# Patient Record
Sex: Female | Born: 1985 | Race: Black or African American | Hispanic: No | Marital: Single | Smoking: Former smoker
Health system: Southern US, Community
[De-identification: ages and names within clinical notes are randomized; demographics above are authoritative.]

## PROBLEM LIST (undated history)

## (undated) DIAGNOSIS — Z789 Other specified health status: Secondary | ICD-10-CM

## (undated) HISTORY — PX: NO PAST SURGERIES: SHX2092

---

## 2013-05-31 ENCOUNTER — Encounter (HOSPITAL_COMMUNITY): Payer: Self-pay | Admitting: Emergency Medicine

## 2013-05-31 ENCOUNTER — Emergency Department (HOSPITAL_COMMUNITY)
Admission: EM | Admit: 2013-05-31 | Discharge: 2013-05-31 | Disposition: A | Discharge status: Home / Self Care | Payer: Medicaid Other | Attending: Emergency Medicine | Admitting: Emergency Medicine

## 2013-05-31 DIAGNOSIS — K0889 Other specified disorders of teeth and supporting structures: Secondary | ICD-10-CM

## 2013-05-31 DIAGNOSIS — H9209 Otalgia, unspecified ear: Secondary | ICD-10-CM | POA: Insufficient documentation

## 2013-05-31 DIAGNOSIS — F17200 Nicotine dependence, unspecified, uncomplicated: Secondary | ICD-10-CM | POA: Insufficient documentation

## 2013-05-31 DIAGNOSIS — K089 Disorder of teeth and supporting structures, unspecified: Secondary | ICD-10-CM | POA: Insufficient documentation

## 2013-05-31 MED ORDER — OXYCODONE-ACETAMINOPHEN 5-325 MG PO TABS: 1.0000 | ORAL_TABLET | ORAL | Status: DC | PRN

## 2013-05-31 MED ORDER — OXYCODONE-ACETAMINOPHEN 5-325 MG PO TABS
1.0000 | ORAL_TABLET | Freq: Once | ORAL | Status: AC
  Administered 2013-05-31: 1 via ORAL
  Filled 2013-05-31: qty 1

## 2013-05-31 MED ORDER — AMOXICILLIN 500 MG PO CAPS: 500.0000 mg | ORAL_CAPSULE | Freq: Three times a day (TID) | ORAL | Status: DC

## 2013-05-31 NOTE — ED Provider Notes (Signed)
Medical screening examination/treatment/procedure(s) were performed by non-physician practitioner and as supervising physician I was immediately available for consultation/collaboration.  Chante Mayson, MD 05/31/13 1619 

## 2013-05-31 NOTE — ED Notes (Signed)
Upper rt tooth pain since wed

## 2013-05-31 NOTE — ED Notes (Signed)
C/O RIGHT UPPER DENTAL PAIN X 2 DAYS. 

## 2013-05-31 NOTE — ED Provider Notes (Signed)
  CSN: [id number]     Arrival date & time 05/31/13  1125 History     First MD Initiated Contact with Patient 05/31/13 1132     Chief Complaint  Patient presents with  . Dental Pain   (Consider location/radiation/quality/duration/timing/severity/associated sxs/prior Treatment) HPI Comments: Patient presents to the emergency department with a dental complaint. Symptoms began on Wednesday. The patient has tried to alleviate pain with Tylenol and Motrin.  Pain rated at a 10/10, characterized as throbbing in nature and located right upper mouth w/ radiation to right ear. Patient denies fever, night sweats, chills, difficulty swallowing or opening mouth, SOB, nuchal rigidity or decreased ROM of neck.  Patient does not have a dentist and requests a resource guide at discharge.   Patient is a 27 y.o. female presenting with tooth pain.  Dental Pain Associated symptoms: no congestion, no drooling, no facial swelling, no fever and no neck pain     History reviewed. No pertinent past medical history. No past surgical history on file. No family history on file. History  Substance Use Topics  . Smoking status: Current Every Day Smoker  . Smokeless tobacco: Not on file  . Alcohol Use: Yes   OB History   Grav Para Term Preterm Abortions TAB SAB Ect Mult Living                 Review of Systems  Constitutional: Negative for fever.  HENT: Positive for ear pain and dental problem. Negative for congestion, sore throat, facial swelling, drooling, trouble swallowing, neck pain and voice change.   All other systems reviewed and are negative.    Allergies  Review of patient's allergies indicates no known allergies.  Home Medications   Current Outpatient Rx  Name  Route  Sig  Dispense  Refill  . amoxicillin (AMOXIL) 500 MG capsule   Oral   Take 1 capsule (500 mg total) by mouth 3 (three) times daily.   30 capsule   0   . oxyCODONE-acetaminophen (PERCOCET/ROXICET) 5-325 MG per tablet    Oral   Take 1 tablet by mouth every 4 (four) hours as needed for pain.   25 tablet   0    BP 117/63  Pulse 101  Temp(Src) 98.3 F (36.8 C)  Resp 16  SpO2 99% Physical Exam  Constitutional: She is oriented to person, place, and time. She appears well-developed and well-nourished. No distress.  HENT:  Head: Normocephalic and atraumatic.  Mouth/Throat: Uvula is midline, oropharynx is clear and moist and mucous membranes are normal. She does not have dentures. No oral lesions. No trismus in the jaw. Abnormal dentition. No dental abscesses, edematous or lacerations.    Patient was otherwise good dentition aside from right upper molar marked graphical documentation  Eyes: Conjunctivae are normal.  Neck: Neck supple.  Neurological: She is alert and oriented to person, place, and time.  Skin: Skin is warm and dry. She is not diaphoretic.  Psychiatric: She has a normal mood and affect.    ED Course   Procedures (including critical care time)  Labs Reviewed - No data to display No results found. 1. Pain, dental     MDM  Patient with toothache.  No gross abscess.  Exam unconcerning for Ludwig's angina or spread of infection.  Will treat with amoxicillin and pain medicine.  Urged patient to follow-up with dentist. Patient is agreeable to plan. Patient is stable at time of discharge     Jeannetta Ellis, PA-C 05/31/13 1422

## 2013-10-17 NOTE — L&D Delivery Note (Signed)
Delivery Note AROM at 0650 with clear fluid. At 6:57 AM a viable female was delivered via  (Presentation: LAO;  ).  APGAR:9/9 , ; weightpending .   Placenta status: Intact, Spontaneous. 3V Cord:  with the following complications:none  Anesthesia:  none Episiotomy: none Lacerations: none Suture Repair: n/a Est. Blood Loss (mL): 200  Mom to postpartum.  Baby to Couplet care / Skin to Skin  Pt plans BTL at 1200.  CRESENZO-DISHMAN,Lynnlee Revels 04/25/2014, 7:09 AM

## 2013-10-17 NOTE — L&D Delivery Note (Signed)

## 2013-11-11 ENCOUNTER — Other Ambulatory Visit (HOSPITAL_COMMUNITY): Payer: Self-pay | Admitting: Nurse Practitioner

## 2013-11-11 DIAGNOSIS — Z3689 Encounter for other specified antenatal screening: Secondary | ICD-10-CM

## 2013-11-11 LAB — OB RESULTS CONSOLE ABO/RH: RH TYPE: POSITIVE

## 2013-11-11 LAB — OB RESULTS CONSOLE GC/CHLAMYDIA
CHLAMYDIA, DNA PROBE: NEGATIVE
Gonorrhea: NEGATIVE

## 2013-11-11 LAB — OB RESULTS CONSOLE RUBELLA ANTIBODY, IGM: Rubella: IMMUNE

## 2013-11-11 LAB — OB RESULTS CONSOLE ANTIBODY SCREEN: Antibody Screen: NEGATIVE

## 2013-11-11 LAB — OB RESULTS CONSOLE RPR: RPR: NONREACTIVE

## 2013-11-11 LAB — OB RESULTS CONSOLE HEPATITIS B SURFACE ANTIGEN: HEP B S AG: NEGATIVE

## 2013-11-11 LAB — OB RESULTS CONSOLE HIV ANTIBODY (ROUTINE TESTING): HIV: NONREACTIVE

## 2013-12-17 ENCOUNTER — Ambulatory Visit (HOSPITAL_COMMUNITY)
Admission: RE | Admit: 2013-12-17 | Discharge: 2013-12-17 | Disposition: A | Discharge status: Home / Self Care | Payer: Medicaid Other | Source: Ambulatory Visit | Attending: Nurse Practitioner | Admitting: Nurse Practitioner

## 2013-12-17 DIAGNOSIS — Z3689 Encounter for other specified antenatal screening: Secondary | ICD-10-CM | POA: Insufficient documentation

## 2013-12-17 IMAGING — US US OB COMP +14 WK
2 series · 12 of 28 positions shown · non-contrast
Comparison: none

[Series 1: us ob comp +14 wk · 1 of 8 slices shown (1 of 2)]
[im 8/8]
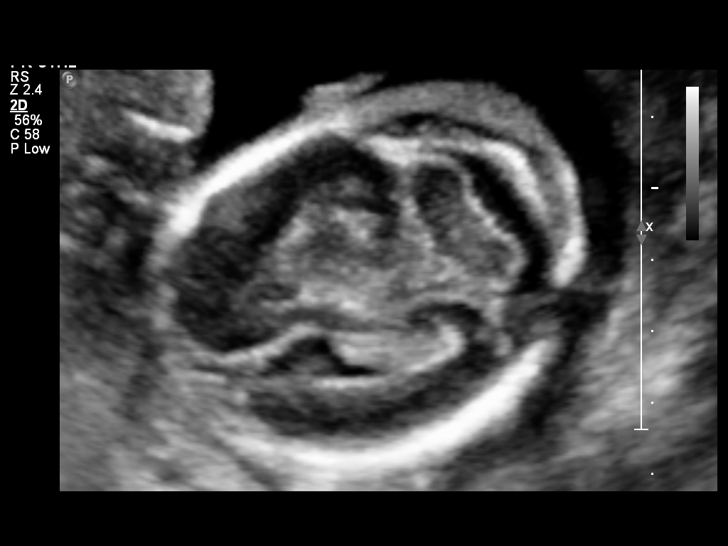

[Series 1: us ob comp +14 wk · 11 of 85 slices shown (2 of 2)]
[im 4/85]
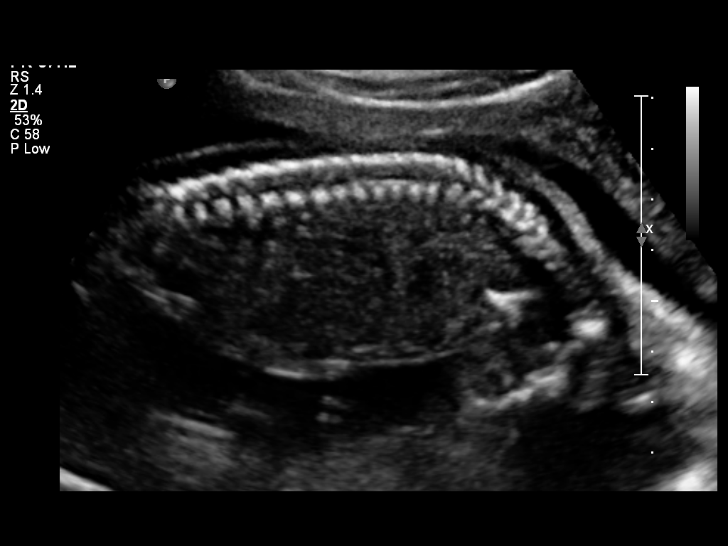
[im 11/85]
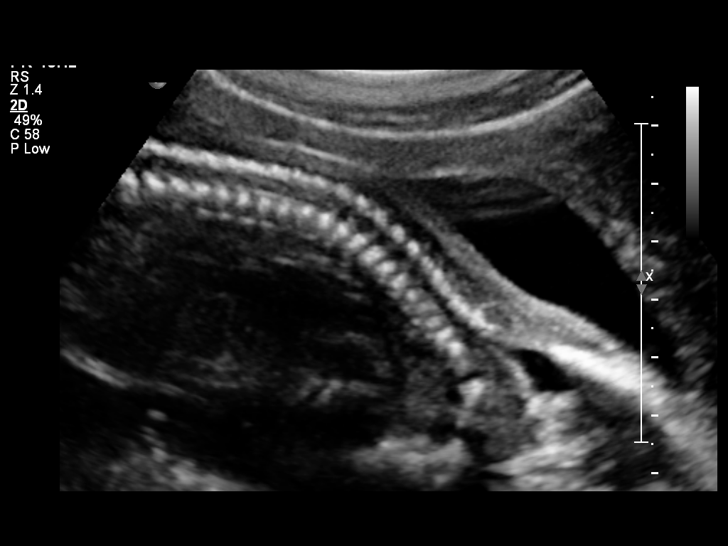
[im 21/85]
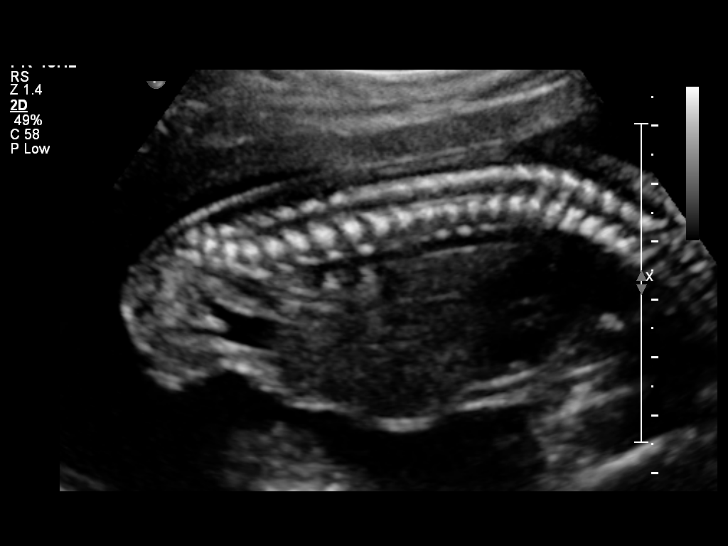
[im 27/85]
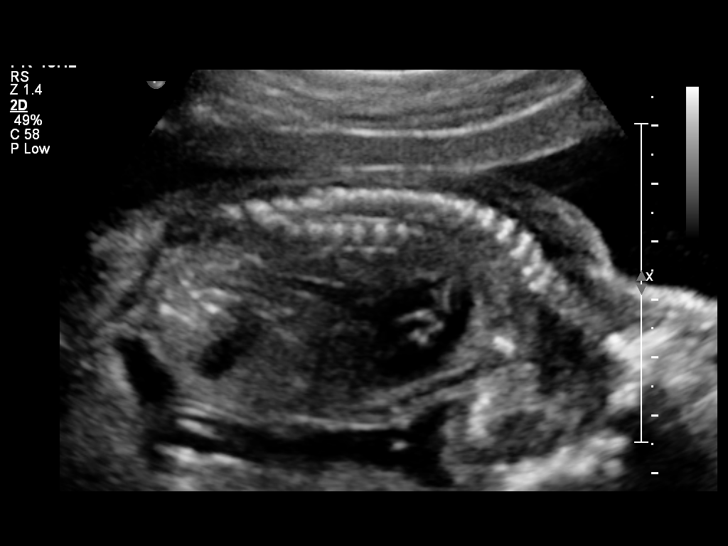
[im 34/85]
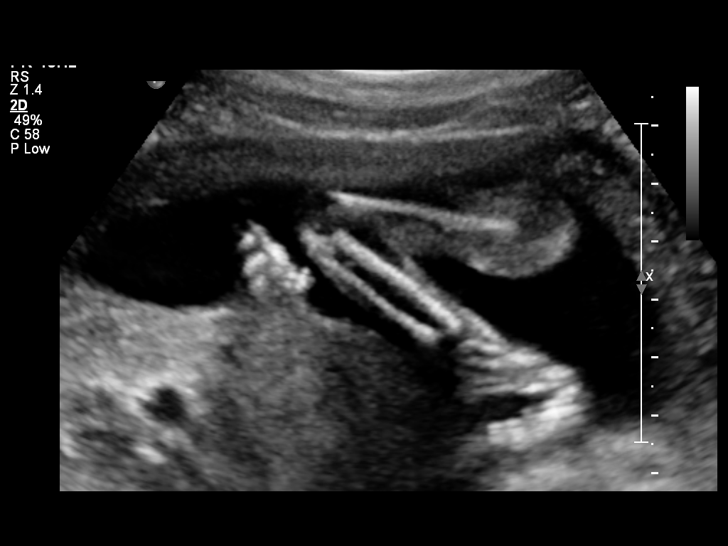
[im 44/85]
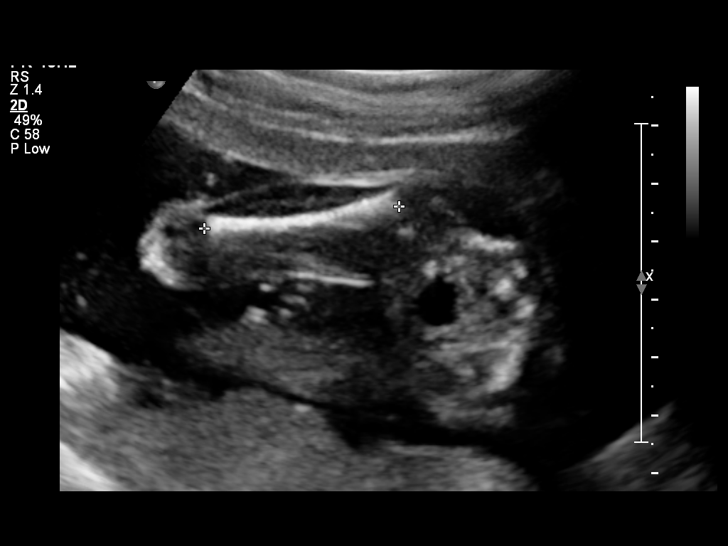
[im 51/85]
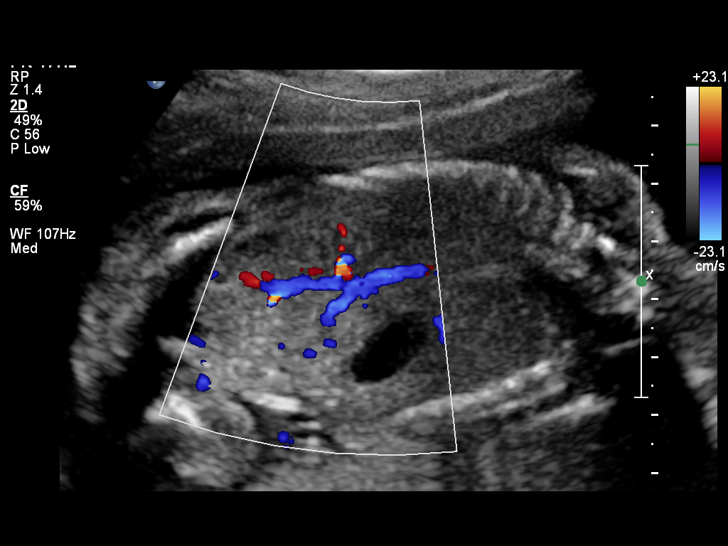
[im 58/85]
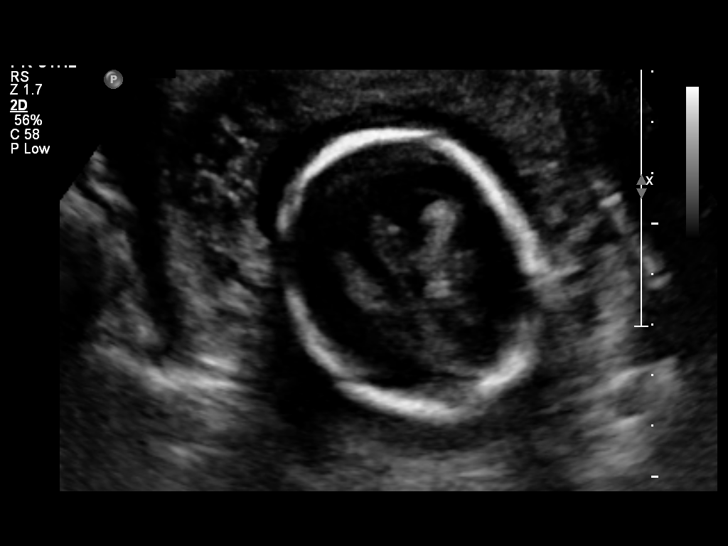
[im 68/85]
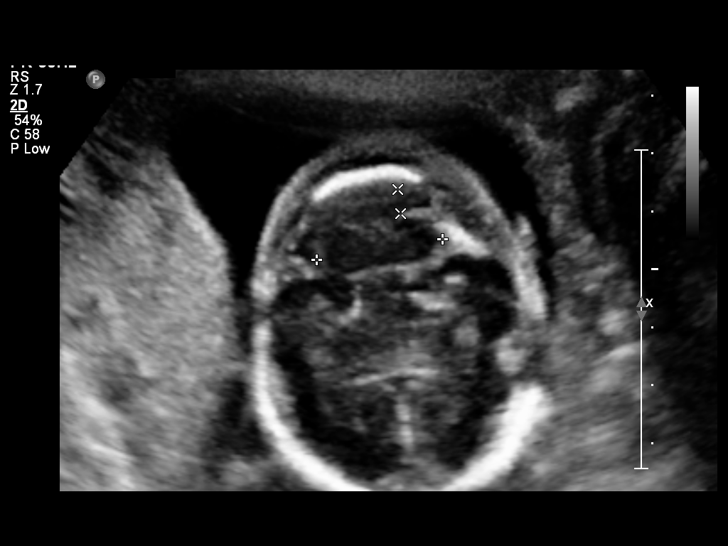
[im 74/85]
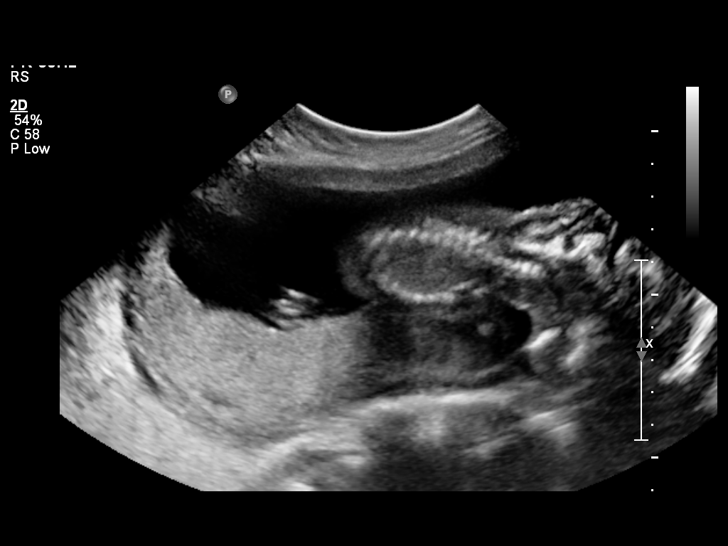
[im 81/85]
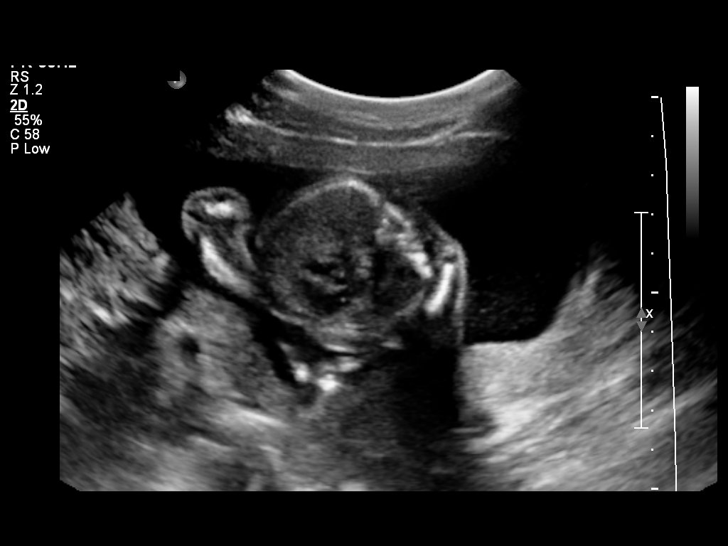

[12 of 28 positions shown; findings below may reference images not displayed]

OBSTETRICS REPORT
                      (Signed Final [DATE] [DATE])

                                                         Faculty Physician
Service(s) Provided

 US OB COMP + 14 WK                                    76805.1
Indications

 Basic anatomic survey                                 [S3]
Fetal Evaluation

 Num Of Fetuses:    1
 Fetal Heart Rate:  135                          bpm
 Cardiac Activity:  Observed
 Presentation:      Cephalic
 Placenta:          Posterior, above cervical
                    os
 P. Cord            Visualized, central
 Insertion:

 Amniotic Fluid
 AFI FV:      Subjectively within normal limits
                                             Larg Pckt:    3.92  cm
Biometry

 BPD:     49.2  mm     G. Age:  20w 6d                CI:        77.43   70 - 86
                                                      FL/HC:      18.8   16.8 -

 HC:       177  mm     G. Age:  20w 1d       57  %    HC/AC:      1.17   1.09 -

 AC:     151.4  mm     G. Age:  20w 2d       61  %    FL/BPD:
 FL:      33.2  mm     G. Age:  20w 3d       61  %    FL/AC:      21.9   20 - 24

 Est. FW:     350  gm    0 lb 12 oz      56  %
Gestational Age

 LMP:           19w 6d        Date:  [DATE]                 EDD:   [DATE]
 U/S Today:     20w 3d                                        EDD:   [DATE]
 Best:          19w 6d     Det. By:  LMP  ([DATE])          EDD:   [DATE]
Anatomy

 Cranium:          Appears normal         Aortic Arch:      Appears normal
 Fetal Cavum:      Appears normal         Ductal Arch:      Appears normal
 Ventricles:       Appears normal         Diaphragm:        Appears normal
 Choroid Plexus:   Appears normal         Stomach:          Appears normal, left
                                                            sided
 Cerebellum:       Appears normal         Abdomen:          Appears normal
 Posterior Fossa:  Appears normal         Abdominal Wall:   Appears nml (cord
                                                            insert, abd wall)
 Nuchal Fold:      Not applicable (>20    Cord Vessels:     Appears normal (3
                   wks GA)                                  vessel cord)
 Face:             Not well visualized    Kidneys:          Appear normal
 Lips:             Not well visualized    Bladder:          Appears normal
 Heart:            Appears normal         Spine:            Appears normal
                   (4CH, axis, and
                   situs)
 RVOT:             Appears normal         Lower             Appears normal
                                          Extremities:
 LVOT:             Appears normal         Upper             Appears normal
                                          Extremities:

 Other:  Fetus appears to be a female. Technically difficult due to fetal
         position. Rt heel visualized.
Cervix Uterus Adnexa

 Cervical Length:    3.09     cm

 Cervix:       Normal appearance by transabdominal scan.
 Uterus:       No abnormality visualized.
 Cul De Sac:   No free fluid seen.
 Left Ovary:    Not visualized. No adnexal mass visualized.
 Right Ovary:   Within normal limits.
 Adnexa:     No abnormality visualized.
Impression

 IUP at 19+6 weeks
 Normal detailed fetal anatomy; limited views of face
 Markers of aneuploidy: none
 Normal amniotic fluid volume
 Measurements consistent with LMP dating
Recommendations

 Follow-up as clinically indicated

 Thank you for sharing in the care of Ms. EDWIN OMAR EDWIN OMAR with
 questions or concerns.

## 2014-03-13 ENCOUNTER — Encounter: Payer: Self-pay | Admitting: *Deleted

## 2014-04-16 LAB — OB RESULTS CONSOLE GBS: STREP GROUP B AG: NEGATIVE

## 2014-04-25 ENCOUNTER — Inpatient Hospital Stay (HOSPITAL_COMMUNITY)
Admission: AD | Admit: 2014-04-25 | Discharge: 2014-04-26 | DRG: 767 | Disposition: A | Discharge status: Home / Self Care | Payer: Medicaid Other | Source: Ambulatory Visit | Attending: Obstetrics & Gynecology | Admitting: Obstetrics & Gynecology

## 2014-04-25 ENCOUNTER — Encounter (HOSPITAL_COMMUNITY): Payer: Medicaid Other | Admitting: Anesthesiology

## 2014-04-25 ENCOUNTER — Encounter (HOSPITAL_COMMUNITY): Payer: Self-pay | Admitting: *Deleted

## 2014-04-25 ENCOUNTER — Encounter (HOSPITAL_COMMUNITY)
Admission: AD | Disposition: A | Discharge status: Home / Self Care | Payer: Self-pay | Source: Ambulatory Visit | Attending: Obstetrics & Gynecology

## 2014-04-25 ENCOUNTER — Inpatient Hospital Stay (HOSPITAL_COMMUNITY): Payer: Medicaid Other | Admitting: Anesthesiology

## 2014-04-25 DIAGNOSIS — O0940 Supervision of pregnancy with grand multiparity, unspecified trimester: Secondary | ICD-10-CM

## 2014-04-25 DIAGNOSIS — K219 Gastro-esophageal reflux disease without esophagitis: Secondary | ICD-10-CM | POA: Diagnosis present

## 2014-04-25 DIAGNOSIS — IMO0001 Reserved for inherently not codable concepts without codable children: Secondary | ICD-10-CM

## 2014-04-25 DIAGNOSIS — Z302 Encounter for sterilization: Secondary | ICD-10-CM

## 2014-04-25 DIAGNOSIS — O99344 Other mental disorders complicating childbirth: Principal | ICD-10-CM | POA: Diagnosis present

## 2014-04-25 DIAGNOSIS — O99334 Smoking (tobacco) complicating childbirth: Secondary | ICD-10-CM | POA: Diagnosis present

## 2014-04-25 DIAGNOSIS — F1210 Cannabis abuse, uncomplicated: Secondary | ICD-10-CM | POA: Diagnosis present

## 2014-04-25 DIAGNOSIS — O479 False labor, unspecified: Secondary | ICD-10-CM | POA: Diagnosis present

## 2014-04-25 HISTORY — PX: TUBAL LIGATION: SHX77

## 2014-04-25 HISTORY — DX: Other specified health status: Z78.9

## 2014-04-25 LAB — ABO/RH: ABO/RH(D): O NEG

## 2014-04-25 LAB — CBC
HEMATOCRIT: 32.9 % — AB (ref 36.0–46.0)
Hemoglobin: 11.2 g/dL — ABNORMAL LOW (ref 12.0–15.0)
MCH: 30.4 pg (ref 26.0–34.0)
MCHC: 34 g/dL (ref 30.0–36.0)
MCV: 89.4 fL (ref 78.0–100.0)
PLATELETS: 223 10*3/uL (ref 150–400)
RBC: 3.68 MIL/uL — AB (ref 3.87–5.11)
RDW: 13.5 % (ref 11.5–15.5)
WBC: 14.4 10*3/uL — AB (ref 4.0–10.5)

## 2014-04-25 LAB — SAMPLE TO BLOOD BANK

## 2014-04-25 LAB — RPR

## 2014-04-25 SURGERY — LIGATION, FALLOPIAN TUBE, POSTPARTUM
Anesthesia: Spinal | Site: Abdomen | Laterality: Bilateral

## 2014-04-25 MED ORDER — OXYTOCIN BOLUS FROM INFUSION
500.0000 mL | INTRAVENOUS | Status: DC
  Administered 2014-04-25: 500 mL via INTRAVENOUS

## 2014-04-25 MED ORDER — MIDAZOLAM HCL 2 MG/2ML IJ SOLN
INTRAMUSCULAR | Status: AC
  Filled 2014-04-25: qty 2

## 2014-04-25 MED ORDER — ONDANSETRON HCL 4 MG PO TABS: 4.0000 mg | ORAL_TABLET | ORAL | Status: DC | PRN

## 2014-04-25 MED ORDER — MORPHINE SULFATE 0.5 MG/ML IJ SOLN
INTRAMUSCULAR | Status: AC
  Filled 2014-04-25: qty 10

## 2014-04-25 MED ORDER — FENTANYL CITRATE 0.05 MG/ML IJ SOLN
INTRAMUSCULAR | Status: AC
  Filled 2014-04-25: qty 2

## 2014-04-25 MED ORDER — PHENYLEPHRINE 40 MCG/ML (10ML) SYRINGE FOR IV PUSH (FOR BLOOD PRESSURE SUPPORT): 80.0000 ug | PREFILLED_SYRINGE | INTRAVENOUS | Status: DC | PRN

## 2014-04-25 MED ORDER — MEPERIDINE HCL 25 MG/ML IJ SOLN: 6.2500 mg | INTRAMUSCULAR | Status: DC | PRN

## 2014-04-25 MED ORDER — ONDANSETRON HCL 4 MG/2ML IJ SOLN
INTRAMUSCULAR | Status: DC | PRN
  Administered 2014-04-25: 4 mg via INTRAVENOUS

## 2014-04-25 MED ORDER — ONDANSETRON HCL 4 MG/2ML IJ SOLN: 4.0000 mg | Freq: Four times a day (QID) | INTRAMUSCULAR | Status: DC | PRN

## 2014-04-25 MED ORDER — EPHEDRINE 5 MG/ML INJ: 10.0000 mg | INTRAVENOUS | Status: DC | PRN

## 2014-04-25 MED ORDER — METOCLOPRAMIDE HCL 5 MG/ML IJ SOLN: 10.0000 mg | Freq: Once | INTRAMUSCULAR | Status: DC | PRN

## 2014-04-25 MED ORDER — LACTATED RINGERS IV SOLN
500.0000 mL | Freq: Once | INTRAVENOUS | Status: AC
  Administered 2014-04-25: 500 mL via INTRAVENOUS

## 2014-04-25 MED ORDER — OXYCODONE-ACETAMINOPHEN 5-325 MG PO TABS: 1.0000 | ORAL_TABLET | ORAL | Status: DC | PRN

## 2014-04-25 MED ORDER — LIDOCAINE HCL (PF) 1 % IJ SOLN
30.0000 mL | INTRAMUSCULAR | Status: DC | PRN
  Filled 2014-04-25: qty 30

## 2014-04-25 MED ORDER — DIPHENHYDRAMINE HCL 50 MG/ML IJ SOLN: 12.5000 mg | INTRAMUSCULAR | Status: DC | PRN

## 2014-04-25 MED ORDER — MEASLES, MUMPS & RUBELLA VAC ~~LOC~~ INJ
0.5000 mL | INJECTION | Freq: Once | SUBCUTANEOUS | Status: DC
Start: 2014-04-26 — End: 2014-04-26

## 2014-04-25 MED ORDER — ONDANSETRON HCL 4 MG/2ML IJ SOLN
INTRAMUSCULAR | Status: AC
  Filled 2014-04-25: qty 2

## 2014-04-25 MED ORDER — LANOLIN HYDROUS EX OINT: TOPICAL_OINTMENT | CUTANEOUS | Status: DC | PRN

## 2014-04-25 MED ORDER — BUPIVACAINE HCL (PF) 0.5 % IJ SOLN
INTRAMUSCULAR | Status: DC | PRN
  Administered 2014-04-25: 30 mL

## 2014-04-25 MED ORDER — OXYTOCIN 40 UNITS IN LACTATED RINGERS INFUSION - SIMPLE MED
62.5000 mL/h | INTRAVENOUS | Status: DC
  Filled 2014-04-25: qty 1000

## 2014-04-25 MED ORDER — FENTANYL CITRATE 0.05 MG/ML IJ SOLN: 100.0000 ug | INTRAMUSCULAR | Status: DC | PRN

## 2014-04-25 MED ORDER — FENTANYL CITRATE 0.05 MG/ML IJ SOLN
25.0000 ug | INTRAMUSCULAR | Status: DC | PRN
  Administered 2014-04-25 (×2): 25 ug via INTRAVENOUS

## 2014-04-25 MED ORDER — LACTATED RINGERS IV SOLN
INTRAVENOUS | Status: DC | PRN
  Administered 2014-04-25 (×2): via INTRAVENOUS

## 2014-04-25 MED ORDER — SIMETHICONE 80 MG PO CHEW: 80.0000 mg | CHEWABLE_TABLET | ORAL | Status: DC | PRN

## 2014-04-25 MED ORDER — ONDANSETRON HCL 4 MG/2ML IJ SOLN: 4.0000 mg | INTRAMUSCULAR | Status: DC | PRN

## 2014-04-25 MED ORDER — OXYCODONE-ACETAMINOPHEN 5-325 MG PO TABS
1.0000 | ORAL_TABLET | ORAL | Status: DC | PRN
  Administered 2014-04-25: 2 via ORAL
  Administered 2014-04-26 (×3): 1 via ORAL
  Filled 2014-04-25 (×2): qty 1
  Filled 2014-04-25: qty 2
  Filled 2014-04-25: qty 1

## 2014-04-25 MED ORDER — PHENYLEPHRINE 40 MCG/ML (10ML) SYRINGE FOR IV PUSH (FOR BLOOD PRESSURE SUPPORT)
80.0000 ug | PREFILLED_SYRINGE | INTRAVENOUS | Status: DC | PRN
  Filled 2014-04-25: qty 10

## 2014-04-25 MED ORDER — ZOLPIDEM TARTRATE 5 MG PO TABS: 5.0000 mg | ORAL_TABLET | Freq: Every evening | ORAL | Status: DC | PRN

## 2014-04-25 MED ORDER — FENTANYL CITRATE 0.05 MG/ML IJ SOLN
INTRAMUSCULAR | Status: DC | PRN
  Administered 2014-04-25 (×2): 50 ug via INTRAVENOUS
  Administered 2014-04-25: 100 ug via INTRAVENOUS

## 2014-04-25 MED ORDER — BENZOCAINE-MENTHOL 20-0.5 % EX AERO: 1.0000 "application " | INHALATION_SPRAY | CUTANEOUS | Status: DC | PRN

## 2014-04-25 MED ORDER — DIPHENHYDRAMINE HCL 25 MG PO CAPS: 25.0000 mg | ORAL_CAPSULE | Freq: Four times a day (QID) | ORAL | Status: DC | PRN

## 2014-04-25 MED ORDER — FERROUS SULFATE 325 (65 FE) MG PO TABS
325.0000 mg | ORAL_TABLET | Freq: Two times a day (BID) | ORAL | Status: DC
  Administered 2014-04-25 – 2014-04-26 (×2): 325 mg via ORAL
  Filled 2014-04-25 (×2): qty 1

## 2014-04-25 MED ORDER — LACTATED RINGERS IV SOLN: 500.0000 mL | INTRAVENOUS | Status: DC | PRN

## 2014-04-25 MED ORDER — PRENATAL MULTIVITAMIN CH
1.0000 | ORAL_TABLET | Freq: Every day | ORAL | Status: DC
  Administered 2014-04-26: 1 via ORAL
  Filled 2014-04-25: qty 1

## 2014-04-25 MED ORDER — SENNOSIDES-DOCUSATE SODIUM 8.6-50 MG PO TABS
2.0000 | ORAL_TABLET | ORAL | Status: DC
  Administered 2014-04-26: 2 via ORAL
  Filled 2014-04-25: qty 2

## 2014-04-25 MED ORDER — OXYTOCIN 40 UNITS IN LACTATED RINGERS INFUSION - SIMPLE MED: 62.5000 mL/h | INTRAVENOUS | Status: DC | PRN

## 2014-04-25 MED ORDER — CITRIC ACID-SODIUM CITRATE 334-500 MG/5ML PO SOLN
30.0000 mL | ORAL | Status: DC | PRN
  Administered 2014-04-25: 30 mL via ORAL
  Filled 2014-04-25: qty 15

## 2014-04-25 MED ORDER — 0.9 % SODIUM CHLORIDE (POUR BTL) OPTIME
TOPICAL | Status: DC | PRN
  Administered 2014-04-25: 1000 mL

## 2014-04-25 MED ORDER — TETANUS-DIPHTH-ACELL PERTUSSIS 5-2.5-18.5 LF-MCG/0.5 IM SUSP: 0.5000 mL | Freq: Once | INTRAMUSCULAR | Status: DC

## 2014-04-25 MED ORDER — WITCH HAZEL-GLYCERIN EX PADS: 1.0000 "application " | MEDICATED_PAD | CUTANEOUS | Status: DC | PRN

## 2014-04-25 MED ORDER — IBUPROFEN 600 MG PO TABS
600.0000 mg | ORAL_TABLET | Freq: Four times a day (QID) | ORAL | Status: DC
  Administered 2014-04-25 – 2014-04-26 (×4): 600 mg via ORAL
  Filled 2014-04-25 (×4): qty 1

## 2014-04-25 MED ORDER — FENTANYL CITRATE 0.05 MG/ML IJ SOLN
INTRAMUSCULAR | Status: AC
  Filled 2014-04-25: qty 5

## 2014-04-25 MED ORDER — LACTATED RINGERS IV SOLN
INTRAVENOUS | Status: DC
  Administered 2014-04-25: 06:00:00 via INTRAVENOUS

## 2014-04-25 MED ORDER — IBUPROFEN 600 MG PO TABS
600.0000 mg | ORAL_TABLET | Freq: Four times a day (QID) | ORAL | Status: DC | PRN
  Administered 2014-04-25: 600 mg via ORAL
  Filled 2014-04-25: qty 1

## 2014-04-25 MED ORDER — METHYLERGONOVINE MALEATE 0.2 MG PO TABS: 0.2000 mg | ORAL_TABLET | ORAL | Status: DC | PRN

## 2014-04-25 MED ORDER — BUPIVACAINE IN DEXTROSE 0.75-8.25 % IT SOLN
INTRATHECAL | Status: DC | PRN
  Administered 2014-04-25: 2 mL via INTRATHECAL

## 2014-04-25 MED ORDER — FLEET ENEMA 7-19 GM/118ML RE ENEM: 1.0000 | ENEMA | Freq: Every day | RECTAL | Status: DC | PRN

## 2014-04-25 MED ORDER — BUPIVACAINE HCL (PF) 0.5 % IJ SOLN
INTRAMUSCULAR | Status: AC
  Filled 2014-04-25: qty 30

## 2014-04-25 MED ORDER — ACETAMINOPHEN 325 MG PO TABS: 650.0000 mg | ORAL_TABLET | ORAL | Status: DC | PRN

## 2014-04-25 MED ORDER — FLEET ENEMA 7-19 GM/118ML RE ENEM: 1.0000 | ENEMA | RECTAL | Status: DC | PRN

## 2014-04-25 MED ORDER — BISACODYL 10 MG RE SUPP: 10.0000 mg | Freq: Every day | RECTAL | Status: DC | PRN

## 2014-04-25 MED ORDER — MIDAZOLAM HCL 2 MG/2ML IJ SOLN
INTRAMUSCULAR | Status: DC | PRN
  Administered 2014-04-25: 2 mg via INTRAVENOUS
  Administered 2014-04-25 (×2): 1 mg via INTRAVENOUS

## 2014-04-25 MED ORDER — METHYLERGONOVINE MALEATE 0.2 MG/ML IJ SOLN: 0.2000 mg | INTRAMUSCULAR | Status: DC | PRN

## 2014-04-25 MED ORDER — FENTANYL CITRATE 0.05 MG/ML IJ SOLN
INTRAMUSCULAR | Status: AC
  Administered 2014-04-25: 25 ug via INTRAVENOUS
  Filled 2014-04-25: qty 2

## 2014-04-25 MED ORDER — DIBUCAINE 1 % RE OINT: 1.0000 "application " | TOPICAL_OINTMENT | RECTAL | Status: DC | PRN

## 2014-04-25 MED ORDER — FENTANYL 2.5 MCG/ML BUPIVACAINE 1/10 % EPIDURAL INFUSION (WH - ANES)
14.0000 mL/h | INTRAMUSCULAR | Status: DC | PRN
  Filled 2014-04-25: qty 125

## 2014-04-25 SURGICAL SUPPLY — 26 items
BENZOIN TINCTURE PRP APPL 2/3 (GAUZE/BANDAGES/DRESSINGS) IMPLANT
BLADE 11 SAFETY STRL DISP (BLADE) ×3 IMPLANT
CHLORAPREP W/TINT 26ML (MISCELLANEOUS) ×3 IMPLANT
CLIP FILSHIE TUBAL LIGA STRL (Clip) ×3 IMPLANT
CLOTH BEACON ORANGE TIMEOUT ST (SAFETY) ×3 IMPLANT
DRSG OPSITE POSTOP 3X4 (GAUZE/BANDAGES/DRESSINGS) ×3 IMPLANT
ELECT REM PT RETURN 9FT ADLT (ELECTROSURGICAL) ×3
ELECTRODE REM PT RTRN 9FT ADLT (ELECTROSURGICAL) ×1 IMPLANT
GLOVE BIOGEL PI IND STRL 7.0 (GLOVE) ×3 IMPLANT
GLOVE BIOGEL PI INDICATOR 7.0 (GLOVE) ×6
GLOVE ECLIPSE 7.0 STRL STRAW (GLOVE) ×3 IMPLANT
GOWN STRL REUS W/TWL LRG LVL3 (GOWN DISPOSABLE) ×6 IMPLANT
NEEDLE HYPO 25X1 1.5 SAFETY (NEEDLE) ×3 IMPLANT
NS IRRIG 1000ML POUR BTL (IV SOLUTION) ×3 IMPLANT
PACK ABDOMINAL MINOR (CUSTOM PROCEDURE TRAY) ×3 IMPLANT
PENCIL BUTTON HOLSTER BLD 10FT (ELECTRODE) IMPLANT
SPONGE GAUZE 2X2 8PLY STER LF (GAUZE/BANDAGES/DRESSINGS) ×1
SPONGE GAUZE 2X2 8PLY STRL LF (GAUZE/BANDAGES/DRESSINGS) ×2 IMPLANT
SPONGE LAP 4X18 X RAY DECT (DISPOSABLE) ×3 IMPLANT
SUT VIC AB 0 CT1 27 (SUTURE) ×2
SUT VIC AB 0 CT1 27XBRD ANBCTR (SUTURE) ×1 IMPLANT
SUT VICRYL 4-0 PS2 18IN ABS (SUTURE) ×3 IMPLANT
SYR CONTROL 10ML LL (SYRINGE) ×3 IMPLANT
TOWEL OR 17X24 6PK STRL BLUE (TOWEL DISPOSABLE) ×6 IMPLANT
TRAY FOLEY CATH 14FR (SET/KITS/TRAYS/PACK) ×3 IMPLANT
WATER STERILE IRR 1000ML POUR (IV SOLUTION) IMPLANT

## 2014-04-25 NOTE — H&P (Signed)
April Stark is a 28 y.o. female [phone] at 7538w2 presenting for onset of labor. Ctx began this morning approx around 3 am. Noted occurrence every 7-10 minutes, lasting 30 sec in duration. Denies LOF, vag bleeding. Still feeling baby move well. Additionally c/o URI sx since approx 1-2 days ago. No fevers, no medications to relieve sx.  PNC at [facility] at 13w. Dated by LMP, agreed with US. Denies complications. THC positive early in pregnancy with subsequent neg. Tobacco use. Colpo 2010, did not f/up.   History OB History   Grav Para Term Preterm Abortions TAB SAB Ect Mult Living   6 2 2  3 3    2      1. TAB 2. Term SVD 7lb 2oz 3. SAB 4. Term SVD 6lbs 5. TAB 6. Current   Past Medical History  Diagnosis Date  . Medical history non-contributory    Past Surgical History  Procedure Laterality Date  . No past surgeries     Family History: family history is not on file. Social History:  reports that she quit smoking about 5 months ago. She does not have any smokeless tobacco history on file. She reports that she does not drink alcohol or use illicit drugs.   Prenatal Transfer Tool  Maternal Diabetes: No Genetic Screening: Normal Maternal Ultrasounds/Referrals: Normal Fetal Ultrasounds or other Referrals:  None Maternal Substance Abuse:  Yes:  Type: Smoker, Marijuana Significant Maternal Medications:  None Significant Maternal Lab Results:  None; GBS neg Other Comments:  None  ROS AS per HPI, otherwise negative  Dilation: 4 Effacement (%): 80 Station: -3;-2 Exam by:: A. Gagliardo, RN Blood pressure 139/71, pulse 107, temperature 99.8 F (37.7 C), temperature source Oral, resp. rate 20, SpO2 98.00%. Exam Physical Exam  Constitutional: She is oriented to person, place, and time. She appears well-developed and well-nourished.  HENT:  Head: Normocephalic and atraumatic.  Mouth/Throat: No oropharyngeal exudate.  Nasal turbinates inflammed bilat  Neck: Normal range of motion. Neck  supple.  Cardiovascular: Normal rate.   Respiratory: Effort normal.  GI: Soft.  gravid  Musculoskeletal: Normal range of motion. She exhibits no edema.  Lymphadenopathy:    She has no cervical adenopathy.  Neurological: She is alert and oriented to person, place, and time.  Skin: Skin is dry.  Psychiatric: She has a normal mood and affect. Her behavior is normal.    Prenatal labs: ABO, Rh: O/Positive/-- (01/26 0000) Antibody: Negative (01/26 0000) Rubella: Immune (01/26 0000) RPR: Nonreactive (01/26 0000)  HBsAg: Negative (01/26 0000)  HIV: Non-reactive (01/26 0000)  GBS: Negative (07/01 0000)    FHT-  150, mod var, post accel, variable decl Ctx q4-5  Assessment/Plan: Ms April Stark is a 28 y.o [id number] who presents at 38w2 in active labor. Will admit to L&D for expectant management  #Labor: expectant management  #Pain: fentanyl/epidural #FWB Cat II #ID: GBS: negative  #MOF: breast  #MOC: BTL papers signed #PAP: abn with colpo 2010, unclear f/up since then  Case d/w Cathie BeamsFran Cresenzo-Dishmon, CNM  Anselm LisMarsh, Melanie 04/25/2014, 5:55 AM       I have seen and examined this patient and agree the above assessment. CRESENZO-DISHMAN,April Stark 04/25/2014 7:46 AM '

## 2014-04-25 NOTE — Anesthesia Postprocedure Evaluation (Signed)
  Anesthesia Post-op Note  Patient: April Stark  Procedure(s) Performed: Procedure(s): POST PARTUM TUBAL LIGATION (Bilateral)  Patient Location: PACU  Anesthesia Type:Spinal  Level of Consciousness: awake, alert  and oriented  Airway and Oxygen Therapy: Patient Spontanous Breathing  Post-op Pain: none  Post-op Assessment: Post-op Vital signs reviewed, Patient's Cardiovascular Status Stable, Respiratory Function Stable, Patent Airway, No signs of Nausea or vomiting, Pain level controlled, No headache and No backache  Post-op Vital Signs: Reviewed and stable  Last Vitals:  Filed Vitals:   04/25/14 1230  BP:   Pulse: 80  Temp: 36.7 C  Resp: 19    Complications: No apparent anesthesia complications

## 2014-04-25 NOTE — Transfer of Care (Signed)
Immediate Anesthesia Transfer of Care Note  Patient: April Stark  Procedure(s) Performed: Procedure(s): POST PARTUM TUBAL LIGATION (Bilateral)  Patient Location: PACU  Anesthesia Type:Spinal  Level of Consciousness: awake, alert  and oriented  Airway & Oxygen Therapy: Patient Spontanous Breathing and Patient connected to nasal cannula oxygen  Post-op Assessment: Report given to PACU RN  Post vital signs: Reviewed  Complications: No apparent anesthesia complications

## 2014-04-25 NOTE — Progress Notes (Signed)
Faculty Practice OB/GYN Attending Note  28 y.o. [phone] at 7487w2d s/p SVD earlier this morning who desires permanent sterilization.  Other reversible forms of contraception were discussed with patient; she declines all other modalities. Risks of procedure discussed with patient including but not limited to: risk of regret, permanence of method, bleeding, infection, injury to surrounding organs and need for additional procedures.  Failure risk of 1-2 % with increased risk of ectopic gestation if pregnancy occurs was also discussed with patient.  Patient verbalized understanding of these risks and wants to proceed with sterilization.  Written informed consent obtained.  To OR when ready.     April CollinsUGONNA  Arshia Spellman, MD, FACOG Attending Obstetrician & Gynecologist Faculty Practice, [facility]

## 2014-04-25 NOTE — MAU Note (Signed)
Contractions started around 0300.  Denies LOF/VB.

## 2014-04-25 NOTE — Op Note (Signed)
April HecklerQuiana Stark 04/25/2014  PREOPERATIVE DIAGNOSES: Multiparity, undesired fertility  POSTOPERATIVE DIAGNOSES: Multiparity, undesired fertility  PROCEDURE:  Postpartum Bilateral Tubal Sterilization using Filshie Clips   SURGEON: Dr.  Jaynie CollinsUgonna [name]  ANESTHESIA:  Epidural and local analgesia using 30 ml of 0.5% Marcaine  COMPLICATIONS:  None immediate.  ESTIMATED BLOOD LOSS: 25 ml.  FLUIDS: 1200 ml LR.  URINE OUTPUT:  50 ml of clear urine.  INDICATIONS:  28 y.o. [id number] with undesired fertility,status post vaginal delivery, desires permanent sterilization.  Other reversible forms of contraception were discussed with patient; she declines all other modalities. Risks of procedure discussed with patient including but not limited to: risk of regret, permanence of method, bleeding, infection, injury to surrounding organs and need for additional procedures.  Failure risk of 1 -2 % with increased risk of ectopic gestation if pregnancy occurs was also discussed with patient.      FINDINGS:  Normal uterus, tubes, and ovaries.  PROCEDURE DETAILS: The patient was taken to the operating room where her epidural anesthesia was dosed up to surgical level and found to be adequate.  She was then placed in the dorsal supine position and prepped and draped in sterile fashion.  After an adequate timeout was performed, attention was turned to the patient's abdomen where a small transverse skin incision was made under the umbilical fold. The incision was taken down to the layer of fascia using the scalpel, and fascia was incised, and extended bilaterally using Mayo scissors. The peritoneum was entered in a sharp fashion. Attention was then turned to the patient's uterus, and left fallopian tube was identified and followed out to the fimbriated end.  A Filshie clip was placed on the left fallopian tube about 3 cm from the cornual attachment, with care given to incorporate the underlying mesosalpinx.  A similar process  was carried out on the right side allowing for bilateral tubal sterilization.  Good hemostasis was noted overall.  Local analgesia was injected into both Filshie application sites.The instruments were then removed from the patient's abdomen and the fascial incision was repaired with 0 Vicryl, and the skin was closed with a 4-0 Vicryl subcuticular stitch. The patient tolerated the procedure well.  Instrument, sponge, and needle counts were correct times two.  The patient was then taken to the recovery room awake and in stable condition.  April CollinsUGONNA  April Morash, MD, FACOG Attending Obstetrician & Gynecologist Faculty Practice, [facility]

## 2014-04-25 NOTE — Progress Notes (Signed)
UR chart review completed.  

## 2014-04-25 NOTE — Anesthesia Preprocedure Evaluation (Signed)
Anesthesia Evaluation  Patient identified by MRN, date of birth, ID band Patient awake    Reviewed: Allergy & Precautions, H&P , NPO status , Patient's Chart, lab work & pertinent test results  Airway Mallampati: II TM Distance: >3 FB Neck ROM: Full    Dental no notable dental hx. (+) Teeth Intact   Pulmonary former smoker,  breath sounds clear to auscultation  Pulmonary exam normal       Cardiovascular negative cardio ROS  Rhythm:Regular Rate:Normal     Neuro/Psych negative neurological ROS  negative psych ROS   GI/Hepatic Neg liver ROS, GERD-  Medicated and Controlled,  Endo/Other  negative endocrine ROS  Renal/GU negative Renal ROS  negative genitourinary   Musculoskeletal negative musculoskeletal ROS (+)   Abdominal   Peds  Hematology negative hematology ROS (+)   Anesthesia Other Findings   Reproductive/Obstetrics Multiparity Desires sterilization                           Anesthesia Physical Anesthesia Plan  ASA: II  Anesthesia Plan: Spinal   Post-op Pain Management:    Induction:   Airway Management Planned: Natural Airway  Additional Equipment:   Intra-op Plan:   Post-operative Plan:   Informed Consent: I have reviewed the patients History and Physical, chart, labs and discussed the procedure including the risks, benefits and alternatives for the proposed anesthesia with the patient or authorized representative who has indicated his/her understanding and acceptance.     Plan Discussed with: Anesthesiologist  Anesthesia Plan Comments:         Anesthesia Quick Evaluation

## 2014-04-25 NOTE — Anesthesia Procedure Notes (Signed)
Spinal  Patient location during procedure: OR Start time: 04/25/2014 11:52 AM Staffing Anesthesiologist: Umaima Scholten A. Performed by: anesthesiologist  Preanesthetic Checklist Completed: patient identified, site marked, surgical consent, pre-op evaluation, timeout performed, IV checked, risks and benefits discussed and monitors and equipment checked Spinal Block Patient position: sitting Prep: site prepped and draped and DuraPrep Patient monitoring: heart rate, cardiac monitor, continuous pulse ox and blood pressure Approach: midline Location: L3-4 Injection technique: single-shot Needle Needle type: Sprotte  Needle gauge: 24 G Needle length: 9 cm Needle insertion depth: 6 cm Assessment Sensory level: T4 Additional Notes Patient tolerated procedure well. Adequate sensory level.

## 2014-04-25 NOTE — Lactation Note (Signed)
This note was copied from the chart of [name]. Lactation Consultation Note  Patient Name: [name]'UToday's Date: 04/25/2014 Reason for consult: Initial assessment Baby 14 hours of life. Mom states that she wants to feed baby formula tonight, and then begin pumping and giving EBM in bottle beginning in the morning. Mom states that this is what she did with her second daughter. Mom states that she is not comfortable with breastfeeding, and can barely tolerate the DEBP. Set up DEBP with instructions. Fitted mom with larger flanges, #36. Mom given cotton pads for leaking. Mom able to hand express colostrum. Enc mom to start pumping in the morning, every 3 hours for 15 minutes. Enc mom to massage breast prior to pumping, and to hand express afterwards. [facility], will call for appointment Monday morning, and [facility]. [facility] given [facility] broch[facility], aware of OP/BFSG and community resources. Enc mom to call for assistance as needed.   Maternal Data Has patient been taught Hand Expression?: Yes Does the patient have breastfeeding experience prior to this delivery?: Yes  Feeding    LATCH Score/Interventions                      Lactation Tools Discussed/Used     Consult Status Consult Status: Follow-up Date: 04/26/14 Follow-up type: In-patient    Geralynn OchsWILLIARD, Ramona Ruark 04/25/2014, 9:09 PM

## 2014-04-26 LAB — KLEIHAUER-BETKE STAIN
# Vials RhIg: 1
Fetal Cells %: 0 %
Quantitation Fetal Hemoglobin: 0 mL

## 2014-04-26 MED ORDER — IBUPROFEN 600 MG PO TABS: 600.0000 mg | ORAL_TABLET | Freq: Four times a day (QID) | ORAL | Status: DC

## 2014-04-26 MED ORDER — RHO D IMMUNE GLOBULIN 1500 UNIT/2ML IJ SOSY
300.0000 ug | PREFILLED_SYRINGE | Freq: Once | INTRAMUSCULAR | Status: AC
  Administered 2014-04-26: 300 ug via INTRAMUSCULAR
  Filled 2014-04-26: qty 2

## 2014-04-26 NOTE — Lactation Note (Signed)
This note was copied from the chart of April Sharna Seabury. Lactation Consultation Note  [facility].  Denies questions. Mother states she will start pumping when she gets home.  P3.  Patient Name: April Beverly GustQuiana Shoberg NWGNF'AToday's Date: 04/26/2014 Reason for consult: Follow-up assessment   Maternal Data    Feeding    LATCH Score/Interventions                      Lactation Tools Discussed/Used     Consult Status Consult Status: Complete    Hardie PulleyBerkelhammer, Ruth Boschen 04/26/2014, 12:16 PM

## 2014-04-26 NOTE — Discharge Summary (Signed)
`````  Attestation of Attending Supervision of Advanced Practitioner: Evaluation and management procedures were performed by the PA/NP/CNM/OB Fellow under my supervision/collaboration. Chart reviewed and agree with management and plan.  Tilda BurrowFERGUSON,Autry Droege V 04/26/2014 7:53 PM

## 2014-04-26 NOTE — Addendum Note (Signed)
Addendum created 04/26/14 [id number] by Lincoln BrighamAngela Draughon Tierria Watson, CRNA   Modules edited: Charges VN, Notes Section   Notes Section:  File: [id number]

## 2014-04-26 NOTE — Discharge Instructions (Signed)

## 2014-04-26 NOTE — Lactation Note (Signed)
This note was copied from the chart of April Ann Powless. Lactation Consultation Note: Follow up visit with mom. She reports that she has not pumped through the night. Plans to get started today. Just pumped one time with Jennifer's assistance.Reports the larger flanges felt fine. [facility] Unsure if she is going home today. To call if wants loaner pump from us  Patient Name: April Beverly GustQuiana Schnorr OZHYQ'MToday's Date: 04/26/2014 Reason for consult: Follow-up assessment   Maternal Data    Feeding    LATCH Score/Interventions                      Lactation Tools Discussed/Used     Consult Status Consult Status: Complete    Pamelia HoitWeeks, Conna Terada D 04/26/2014, 8:35 AM

## 2014-04-26 NOTE — Anesthesia Postprocedure Evaluation (Signed)
Anesthesia Post Note  Patient: April Stark  Procedure(s) Performed: Procedure(s) (LRB): POST PARTUM TUBAL LIGATION (Bilateral)  Anesthesia type: Epidural  Patient location: Mother/Baby  Post pain: Pain level controlled  Post assessment: Post-op Vital signs reviewed  Last Vitals:  Filed Vitals:   04/26/14 0547  BP: 115/79  Pulse: 89  Temp: 36.6 C  Resp: 20    Post vital signs: Reviewed  Level of consciousness:alert  Complications: No apparent anesthesia complications

## 2014-04-26 NOTE — Discharge Summary (Signed)
  Obstetric Discharge Summary Reason for Admission: onset of labor Prenatal Procedures: none Intrapartum Procedures: spontaneous vaginal delivery Postpartum Procedures: P.P. tubal ligation Complications-Operative and Postpartum: none  Hospital Course: Pt presented in active labor, progressed rapidly to vaginal delivery. Pt has done well PP and had her BTL PPD0. PPD1 pt is without complaints, and desires early discharge. Pt meeting all PP milestones without issue and is stabel for discharge. Breastfeeding. F/u HD  Delivery Note AROM at 0650 with clear fluid. At 6:57 AM a viable female was delivered via  (Presentation: LAO;  ).  APGAR:9/9 , ; weightpending .   Placenta status: Intact, Spontaneous. 3V Cord:  with the following complications:none  Anesthesia:  none Episiotomy: none Lacerations: none Suture Repair: n/a Est. Blood Loss (mL): 200  Mom to postpartum.  Baby to Couplet care / Skin to Skin  Pt plans BTL at 1200.  CRESENZO-DISHMAN,FRANCES 04/25/2014, 7:09 AM      H/H: Lab Results  Component Value Date/Time   HGB 11.2* 04/25/2014  6:05 AM   HCT 32.9* 04/25/2014  6:05 AM    Filed Vitals:   04/26/14 0547  BP: 115/79  Pulse: 89  Temp: 97.9 F (36.6 C)  Resp: 20    Physical Exam: VSS NAD Abd: Appropriately tender, ND, Fundus @U -2 Incision: c/d/i No c/c/e, Neg homan's sign, neg cords Lochia Appropriate  Discharge Diagnoses: Term Pregnancy-delivered  Discharge Information: Activity: pelvic rest Diet: routine  Medications: PNV and Ibuprofen Breast feeding:  Yes Condition: stable Instructions: refer to handout Discharge to: home      Medication List    ASK your doctor about these medications       prenatal multivitamin Tabs tablet  Take 1 tablet by mouth daily at 12 noon.           Follow-up Information   [facility] [place] [facility] weeks. (For Postpartum Visit)    Contact information:   [address] [facility] [place]  [phone]      Anupama Piehl RYAN 04/26/2014,10:48 AM

## 2014-04-26 NOTE — Progress Notes (Signed)
Post Partum Day 1 Subjective: no complaints, up ad lib, voiding and tolerating PO Desires possible d/c later in day pending baby's status Objective: Blood pressure 115/79, pulse 89, temperature 97.9 F (36.6 C), temperature source Oral, resp. rate 20, height 5\' 9"  (1.753 m), weight 87.998 kg (194 lb), SpO2 100.00%, unknown if currently breastfeeding.  Physical Exam:  General: alert, cooperative and no distress Lochia: appropriate Uterine Fundus: firm Incision: healing well DVT Evaluation: No evidence of DVT seen on physical exam. No significant calf/ankle edema.   Recent Labs  04/25/14 0605  HGB 11.2*  HCT 32.9*    Assessment/Plan: Plan for discharge tomorrow, Breastfeeding and Contraception tolerated BTL, no complications   LOS: 1 day   Anselm LisMarsh, Melanie 04/26/2014, 7:45 AM   Desires discharge. Will send home today. I spoke with and examined patient and agree with resident's note and plan of care.  Tawana ScaleMichael Ryan Lyfe Monger, MD OB Fellow 04/26/2014 10:46 AM

## 2014-04-27 LAB — RH IG WORKUP (INCLUDES ABO/RH)
ABO/RH(D): O NEG
ANTIBODY SCREEN: NEGATIVE
Gestational Age(Wks): 38
Unit division: 0
WEAK D: POSITIVE

## 2014-04-28 ENCOUNTER — Encounter (HOSPITAL_COMMUNITY): Payer: Self-pay | Admitting: Obstetrics & Gynecology

## 2014-07-03 ENCOUNTER — Encounter: Payer: Self-pay | Admitting: *Deleted

## 2014-08-18 ENCOUNTER — Encounter (HOSPITAL_COMMUNITY): Payer: Self-pay | Admitting: Obstetrics & Gynecology

## 2020-02-12 ENCOUNTER — Other Ambulatory Visit: Payer: Self-pay

## 2020-02-12 ENCOUNTER — Ambulatory Visit: Payer: BC Managed Care – PPO | Admitting: Podiatry

## 2020-02-12 VITALS — Temp 97.6°F

## 2020-02-12 DIAGNOSIS — L309 Dermatitis, unspecified: Secondary | ICD-10-CM | POA: Diagnosis not present

## 2020-02-12 DIAGNOSIS — L989 Disorder of the skin and subcutaneous tissue, unspecified: Secondary | ICD-10-CM | POA: Diagnosis not present

## 2020-02-12 MED ORDER — BETAMETHASONE DIPROPIONATE 0.05 % EX CREA: TOPICAL_CREAM | Freq: Two times a day (BID) | CUTANEOUS | 3 refills | Status: DC

## 2020-02-24 NOTE — Progress Notes (Signed)
   HPI: 34 y.o. female presenting today as a new patient for evaluation of corns and calluses to the bilateral plantar forefoot.  Patient also states that she experiences pain 10/10 in the forefoot most of the time when putting pressure on these callus lesions.  Aggravated by walking.  Patient has been soaking her feet and filing down the callus lesions with minimal relief.  This is been ongoing for years  Past Medical History:  Diagnosis Date  . Medical history non-contributory      Physical Exam: General: The patient is alert and oriented x3 in no acute distress.  Dermatology: Skin is warm, dry and supple bilateral lower extremities. Negative for open lesions or macerations.  Hyperkeratotic callus lesions noted with a central nucleated core to the bilateral feet.  Patient also has inflammatory lesions with hyperpigmentation to the digits 2-5 bilateral toes.   Vascular: Palpable pedal pulses bilaterally. No edema or erythema noted. Capillary refill within normal limits.  Neurological: Epicritic and protective threshold grossly intact bilaterally.   Musculoskeletal Exam: Range of motion within normal limits to all pedal and ankle joints bilateral. Muscle strength 5/5 in all groups bilateral.    Assessment: 1.  Porokeratosis bilateral feet 2.  Dermatitis/hyperpigmentation digits 2-5 bilateral toes   Plan of Care:  1. Patient evaluated.  2.  Excisional debridement of the hyperkeratotic callus tissue was performed using a chisel blade without incident or bleeding.  Recommend OTC corn and callus remover daily.  Recommend good supportive shoes. 3.  Prescription for betamethasone 0.05% cream to apply to the hyperpigmentation inflammatory skin reaction of digits 2-5 to the bilateral toes. 4.  Return to clinic in 6 weeks, if there is no improvement with the inflammatory dermatitis or hyperpigmentation we will refer the patient to dermatology for second opinion      Felecia Shelling, DPM [facility]  Dr. Felecia Shelling, DPM    [address] [place], [place] [address]                Office [phone]  Fax [phone]

## 2020-03-25 ENCOUNTER — Ambulatory Visit: Payer: BC Managed Care – PPO | Admitting: Podiatry

## 2021-07-07 ENCOUNTER — Ambulatory Visit: Payer: BC Managed Care – PPO | Admitting: Nurse Practitioner

## 2023-01-30 ENCOUNTER — Ambulatory Visit: Payer: BC Managed Care – PPO | Admitting: Nurse Practitioner

## 2023-08-10 ENCOUNTER — Ambulatory Visit: Payer: BC Managed Care – PPO | Admitting: Podiatry

## 2023-08-16 ENCOUNTER — Ambulatory Visit (INDEPENDENT_AMBULATORY_CARE_PROVIDER_SITE_OTHER): Payer: BC Managed Care – PPO | Admitting: Podiatry

## 2023-08-16 DIAGNOSIS — Z91199 Patient's noncompliance with other medical treatment and regimen due to unspecified reason: Secondary | ICD-10-CM

## 2023-08-16 NOTE — Progress Notes (Signed)
No show

## 2023-08-22 ENCOUNTER — Ambulatory Visit: Payer: BC Managed Care – PPO | Admitting: Podiatry

## 2023-08-25 ENCOUNTER — Ambulatory Visit: Payer: BC Managed Care – PPO | Admitting: Family Medicine

## 2023-08-25 ENCOUNTER — Telehealth: Payer: Self-pay | Admitting: General Practice

## 2023-08-25 NOTE — Telephone Encounter (Signed)
Pt was a no show for a NP appt with Dr Janee Morn on 08/25/23, no letter, I dismissed.

## 2024-05-16 ENCOUNTER — Encounter: Payer: Self-pay | Admitting: Family Medicine

## 2024-05-16 ENCOUNTER — Ambulatory Visit (INDEPENDENT_AMBULATORY_CARE_PROVIDER_SITE_OTHER): Payer: Self-pay | Admitting: Family Medicine

## 2024-05-16 VITALS — BP 110/70 | HR 101 | Temp 98.2°F | Ht 69.0 in | Wt 136.0 lb

## 2024-05-16 DIAGNOSIS — Z7689 Persons encountering health services in other specified circumstances: Secondary | ICD-10-CM

## 2024-05-16 DIAGNOSIS — Z136 Encounter for screening for cardiovascular disorders: Secondary | ICD-10-CM

## 2024-05-16 DIAGNOSIS — F331 Major depressive disorder, recurrent, moderate: Secondary | ICD-10-CM

## 2024-05-16 DIAGNOSIS — M25551 Pain in right hip: Secondary | ICD-10-CM

## 2024-05-16 DIAGNOSIS — Z0000 Encounter for general adult medical examination without abnormal findings: Secondary | ICD-10-CM

## 2024-05-16 DIAGNOSIS — L309 Dermatitis, unspecified: Secondary | ICD-10-CM

## 2024-05-16 DIAGNOSIS — Z1159 Encounter for screening for other viral diseases: Secondary | ICD-10-CM

## 2024-05-16 DIAGNOSIS — G8929 Other chronic pain: Secondary | ICD-10-CM

## 2024-05-16 DIAGNOSIS — Z114 Encounter for screening for human immunodeficiency virus [HIV]: Secondary | ICD-10-CM

## 2024-05-16 DIAGNOSIS — L209 Atopic dermatitis, unspecified: Secondary | ICD-10-CM

## 2024-05-16 DIAGNOSIS — Z23 Encounter for immunization: Secondary | ICD-10-CM | POA: Diagnosis not present

## 2024-05-16 MED ORDER — BETAMETHASONE DIPROPIONATE 0.05 % EX CREA: TOPICAL_CREAM | Freq: Two times a day (BID) | CUTANEOUS | 3 refills | Status: AC

## 2024-05-16 MED ORDER — ESCITALOPRAM OXALATE 10 MG PO TABS: 10.0000 mg | ORAL_TABLET | Freq: Every day | ORAL | 3 refills | Status: AC

## 2024-05-16 MED ORDER — TRIAMCINOLONE ACETONIDE 40 MG/ML IJ SUSP
40.0000 mg | Freq: Once | INTRAMUSCULAR | Status: AC
  Administered 2024-05-16: 40 mg via INTRAMUSCULAR

## 2024-05-16 NOTE — Patient Instructions (Signed)

## 2024-05-16 NOTE — Progress Notes (Signed)
 April Stark, CMA,acting as a Neurosurgeon for [employer], NP.,have documented all relevant documentation on the behalf of April Creighton, NP,as directed by  April Creighton, NP while in the presence of April Creighton, NP.  Subjective:    Patient ID: April Stark , female    DOB: 1986-10-02 , 38 y.o.   MRN: [id number]  Chief Complaint  Patient presents with   Establish Care    Patient presents today to establish care. She reports she hasn't seen a pcp in years.    Annual Exam    Patient would like to have a physical done. Patient doesn't have any questions or concerns.     HPI Discussed the use of AI scribe software for clinical note transcription with the patient, who gave verbal consent to proceed.  History of Present Illness    April Stark is a 38 year old female who presents with joint pain and swelling.  She has been experiencing joint pain and swelling, particularly in her right hip, fingers, knees, and big toe. The right hip pain has been present for about five years and is described as a 'grinding' sensation. She reports that the pain feels worse when she is less active. Pain in her fingers, specifically the index, middle, and pinky fingers of her right hand, has been present for two to three years, affecting her grip strength and making tasks like squeezing toothpaste difficult.  She has been using over-the-counter medications such as aspirin and Tylenol  for pain management. She previously visited a chiropractor and a foot doctor, who prescribed a cream for her feet, but she did not refill it due to insurance issues. She recently obtained Medicaid and is seeking to address her health concerns more comprehensively.  She experiences significant stress and emotional distress, feeling 'tired' and overwhelmed by her current life situation, including unemployment and separation from her children. She drinks alcohol daily, consuming about three to four beers a day, but does not smoke. She wants to  improve her situation and health.  There is a family history of rheumatoid arthritis, as her aunt has the condition. She is concerned about her symptoms potentially being related to arthritis.     Past Medical History:  Diagnosis Date   Medical history non-contributory      Family History  Problem Relation Age of Onset   Stroke Mother    Hypertension Mother    Hypertension Maternal Grandmother    Cancer Maternal Grandfather      Current Outpatient Medications:    escitalopram  (LEXAPRO ) 10 MG tablet, Take 1 tablet (10 mg total) by mouth daily., Disp: 30 tablet, Rfl: 3   Multiple Vitamins-Minerals (MULTIVITAMIN ADULTS PO), Take 1 capsule by mouth daily at 6 (six) AM., Disp: , Rfl:    betamethasone  dipropionate 0.05 % cream, Apply topically 2 (two) times daily., Disp: 60 g, Rfl: 3   Prenatal Vit-Fe Fumarate-FA (PRENATAL MULTIVITAMIN) TABS tablet, Take 1 tablet by mouth daily at 12 noon., Disp: , Rfl:    No Known Allergies     Social History   Tobacco Use  Smoking Status Former   Current packs/day: 0.00   Types: Cigarettes   Quit date: 10/26/2013   Years since quitting: 10.5  Smokeless Tobacco Not on file   Social History   Substance and Sexual Activity  Alcohol Use Yes      Review of Systems  Constitutional: Negative.   HENT: Negative.    Eyes: Negative.   Respiratory: Negative.    Cardiovascular: Negative.  Gastrointestinal: Negative.   Endocrine: Negative.   Genitourinary: Negative.   Musculoskeletal:  Positive for arthralgias and back pain.  Skin:  Positive for rash.  Allergic/Immunologic: Negative.   Neurological: Negative.   Hematological: Negative.   Psychiatric/Behavioral:  Positive for dysphoric mood. The patient is nervous/anxious.      Today's Vitals   05/16/24 0937  BP: 110/70  Pulse: (!) 101  Temp: 98.2 F (36.8 C)  TempSrc: Oral  Weight: 136 lb (61.7 kg)  Height: 5' 9 (1.753 m)  PainSc: 0-No pain   Body mass index is 20.08 kg/m.   Wt Readings from Last 3 Encounters:  05/16/24 136 lb (61.7 kg)  04/25/14 194 lb (88 kg)     Objective:  Physical Exam Constitutional:      Appearance: Normal appearance.  HENT:     Head: Normocephalic.  Cardiovascular:     Rate and Rhythm: Normal rate and regular rhythm.     Pulses: Normal pulses.     Heart sounds: Normal heart sounds.  Pulmonary:     Effort: Pulmonary effort is normal.     Breath sounds: Normal breath sounds.  Abdominal:     General: Bowel sounds are normal.  Musculoskeletal:        General: Normal range of motion.  Skin:    General: Skin is warm and dry.  Neurological:     General: No focal deficit present.     Mental Status: She is alert and oriented to person, place, and time. Mental status is at baseline.  Psychiatric:        Mood and Affect: Mood normal. Affect is tearful.         Assessment And Plan:     Establishing care with new doctor, encounter for  Encounter for general adult medical examination w/o abnormal findings -     CBC -     CMP14+EGFR  Encounter for screening for HIV -     HIV Antibody (routine testing w rflx)  Encounter for hepatitis C screening test for low risk patient -     Hepatitis C antibody  Immunization due -     Tdap vaccine greater than or equal to 7yo IM  Chronic right hip pain -     Ambulatory referral to Orthopedics -     Triamcinolone  Acetonide  MDD (major depressive disorder), recurrent episode, moderate (HCC) Assessment & Plan:  High depression score with significant stressors including unemployment, separation from children, and chronic pain.  - Prescribe medication. - Refer to counseling services.  Orders: -     Escitalopram  Oxalate; Take 1 tablet (10 mg total) by mouth daily.  Dispense: 30 tablet; Refill: 3 -     Amb ref to [facility]  Dermatitis of both feet Assessment & Plan:  Chronic dermatitis with flaking and itching, exacerbated by moisture and dryness. - Prescribe  topical cream.  Orders: -     Betamethasone  Dipropionate; Apply topically 2 (two) times daily.  Dispense: 60 g; Refill: 3  Screening for cardiovascular condition -     Lipid panel     Assessment & Plan Right hip pain and right hand finger pain and swelling Chronic pain and swelling in right hip and hand, severe at 8/10, likely due to arthritis or musculoskeletal disorder. - Refer to orthopedic specialist for further evaluation. - Administer pain injection for acute pain management.   Alcohol use, daily Daily consumption of 3-4 beers, potentially impacting health and mental health. - Discuss alcohol use and  its impact.   Return for 1 year physical. Patient was given opportunity to ask questions. Patient verbalized understanding of the plan and was able to repeat key elements of the plan. All questions were answered to their satisfaction.   I, April Creighton, NP, have reviewed all documentation for this visit. The documentation on 06/30/23 for the exam, diagnosis, procedures, and orders are all accurate and complete.

## 2024-05-17 LAB — CMP14+EGFR
ALT: 20 IU/L (ref 0–32)
AST: 19 IU/L (ref 0–40)
Albumin: 4.5 g/dL (ref 3.9–4.9)
Alkaline Phosphatase: 82 IU/L (ref 44–121)
BUN/Creatinine Ratio: 8 — ABNORMAL LOW (ref 9–23)
BUN: 7 mg/dL (ref 6–20)
Bilirubin Total: 0.4 mg/dL (ref 0.0–1.2)
CO2: 20 mmol/L (ref 20–29)
Calcium: 9.3 mg/dL (ref 8.7–10.2)
Chloride: 103 mmol/L (ref 96–106)
Creatinine, Ser: 0.83 mg/dL (ref 0.57–1.00)
Globulin, Total: 2.9 g/dL (ref 1.5–4.5)
Glucose: 89 mg/dL (ref 70–99)
Potassium: 4 mmol/L (ref 3.5–5.2)
Sodium: 139 mmol/L (ref 134–144)
Total Protein: 7.4 g/dL (ref 6.0–8.5)
eGFR: 93 mL/min/1.73 (ref >59)

## 2024-05-17 LAB — HEPATITIS C ANTIBODY: Hep C Virus Ab: NONREACTIVE

## 2024-05-17 LAB — CBC
Hematocrit: 42.4 % (ref 34.0–46.6)
Hemoglobin: 14.3 g/dL (ref 11.1–15.9)
MCH: 33.3 pg — ABNORMAL HIGH (ref 26.6–33.0)
MCHC: 33.7 g/dL (ref 31.5–35.7)
MCV: 99 fL — ABNORMAL HIGH (ref 79–97)
Platelets: 254 x10E3/uL (ref 150–450)
RBC: 4.29 x10E6/uL (ref 3.77–5.28)
RDW: 12.4 % (ref 11.7–15.4)
WBC: 7.5 x10E3/uL (ref 3.4–10.8)

## 2024-05-17 LAB — HIV ANTIBODY (ROUTINE TESTING W REFLEX): HIV Screen 4th Generation wRfx: NONREACTIVE

## 2024-05-17 LAB — LIPID PANEL
Chol/HDL Ratio: 2.5 ratio (ref 0.0–4.4)
Cholesterol, Total: 137 mg/dL (ref 100–199)
HDL: 55 mg/dL (ref >39)
LDL Chol Calc (NIH): 69 mg/dL (ref 0–99)
Triglycerides: 64 mg/dL (ref 0–149)
VLDL Cholesterol Cal: 13 mg/dL (ref 5–40)

## 2024-05-21 ENCOUNTER — Ambulatory Visit: Payer: Self-pay | Admitting: Family Medicine

## 2024-05-21 DIAGNOSIS — Z1159 Encounter for screening for other viral diseases: Secondary | ICD-10-CM | POA: Insufficient documentation

## 2024-05-21 DIAGNOSIS — G8929 Other chronic pain: Secondary | ICD-10-CM | POA: Insufficient documentation

## 2024-05-21 DIAGNOSIS — Z7689 Persons encountering health services in other specified circumstances: Secondary | ICD-10-CM | POA: Insufficient documentation

## 2024-05-21 DIAGNOSIS — F331 Major depressive disorder, recurrent, moderate: Secondary | ICD-10-CM | POA: Insufficient documentation

## 2024-05-21 DIAGNOSIS — Z23 Encounter for immunization: Secondary | ICD-10-CM | POA: Insufficient documentation

## 2024-05-21 DIAGNOSIS — Z114 Encounter for screening for human immunodeficiency virus [HIV]: Secondary | ICD-10-CM | POA: Insufficient documentation

## 2024-05-21 DIAGNOSIS — Z136 Encounter for screening for cardiovascular disorders: Secondary | ICD-10-CM | POA: Insufficient documentation

## 2024-05-21 DIAGNOSIS — L309 Dermatitis, unspecified: Secondary | ICD-10-CM | POA: Insufficient documentation

## 2024-05-21 DIAGNOSIS — Z0000 Encounter for general adult medical examination without abnormal findings: Secondary | ICD-10-CM | POA: Insufficient documentation

## 2024-05-21 NOTE — Assessment & Plan Note (Signed)
  Chronic dermatitis with flaking and itching, exacerbated by moisture and dryness. - Prescribe topical cream.

## 2024-05-21 NOTE — Assessment & Plan Note (Signed)
  High depression score with significant stressors including unemployment, separation from children, and chronic pain.  - Prescribe medication. - Refer to counseling services.

## 2024-05-21 NOTE — Progress Notes (Signed)
 Your mcv and mch is slightly high  and sometimes it is caused by vitamin B12 or folate deficiency, excessive alcohol consumption.Start taking a multivitamin daily and minimize your daily alcohol consumption.  Thank you!

## 2024-05-24 ENCOUNTER — Encounter: Admitting: Family Medicine

## 2024-08-19 ENCOUNTER — Encounter: Payer: Self-pay | Admitting: Radiology
